# Patient Record
Sex: Female | Born: 1970 | Race: White | Hispanic: No | Marital: Single | Smoking: Former smoker
Health system: Southern US, Community
[De-identification: ages and names within clinical notes are randomized; demographics above are authoritative.]

## PROBLEM LIST (undated history)

## (undated) HISTORY — PX: TUBAL LIGATION: SHX77

---

## 2014-11-19 ENCOUNTER — Telehealth: Payer: Self-pay | Admitting: General Practice

## 2014-11-20 NOTE — Telephone Encounter (Signed)
[facility] not [facility] any medications. Appointment scheduled for 9:10 on 4/18 with Jannifer Rodneyhristy Hawks, FNP

## 2015-01-07 ENCOUNTER — Ambulatory Visit: Payer: Self-pay | Admitting: Family

## 2017-06-24 ENCOUNTER — Encounter: Payer: Self-pay | Admitting: Family

## 2017-06-24 ENCOUNTER — Ambulatory Visit (INDEPENDENT_AMBULATORY_CARE_PROVIDER_SITE_OTHER): Payer: BLUE CROSS/BLUE SHIELD | Admitting: Family

## 2017-06-24 VITALS — BP 130/85 | HR 97 | Temp 98.8°F | Ht 64.75 in | Wt 186.0 lb

## 2017-06-24 DIAGNOSIS — E663 Overweight: Secondary | ICD-10-CM | POA: Insufficient documentation

## 2017-06-24 DIAGNOSIS — Z0000 Encounter for general adult medical examination without abnormal findings: Secondary | ICD-10-CM | POA: Diagnosis not present

## 2017-06-24 DIAGNOSIS — Z23 Encounter for immunization: Secondary | ICD-10-CM | POA: Diagnosis not present

## 2017-06-24 DIAGNOSIS — E669 Obesity, unspecified: Secondary | ICD-10-CM | POA: Insufficient documentation

## 2017-06-24 NOTE — Patient Instructions (Signed)

## 2017-06-24 NOTE — Progress Notes (Signed)
   Subjective:    Patient ID: Laura Walsh, female    DOB: June 17, 1971, 46 y.o.   MRN: [id number]  HPI PT presents to the office today to establish and CPE. PT has not been to a provider in 20 years or more. Pt denies any headache, palpitations, SOB, or edema at this time.     Review of Systems  All other systems reviewed and are negative.  Social History   Social History  . Marital status: Single    Spouse name: N/A  . Number of children: N/A  . Years of education: N/A   Social History Main Topics  . Smoking status: Former Research scientist (life sciences)  . Smokeless tobacco: Never Used  . Alcohol use No  . Drug use: No  . Sexual activity: Not Currently   Other Topics Concern  . None   Social History Narrative  . None   Family History  Problem Relation Age of Onset  . Diabetes Father        Objective:   Physical Exam  Constitutional: She is oriented to person, place, and time. She appears well-developed and well-nourished. No distress.  HENT:  Head: Normocephalic and atraumatic.  Right Ear: External ear normal.  Left Ear: External ear normal.  Nose: Nose normal.  Mouth/Throat: Oropharynx is clear and moist.  Eyes: Pupils are equal, round, and reactive to light.  Neck: Normal range of motion. Neck supple. No thyromegaly present.  Cardiovascular: Normal rate, regular rhythm, normal heart sounds and intact distal pulses.   No murmur heard. Pulmonary/Chest: Effort normal and breath sounds normal. No respiratory distress. She has no wheezes.  Abdominal: Soft. Bowel sounds are normal. She exhibits no distension. There is no tenderness.  Musculoskeletal: Normal range of motion. She exhibits no edema or tenderness.  Neurological: She is alert and oriented to person, place, and time.  Skin: Skin is warm and dry.  Psychiatric: She has a normal mood and affect. Her behavior is normal. Judgment and thought content normal.  Vitals reviewed.     BP 130/85   Pulse 97   Temp 98.8 F (37.1 C)  (Oral)   Ht 5' 4.75" (1.645 m)   Wt 186 lb (84.4 kg)   BMI 31.19 kg/m      Assessment & Plan:  1. Annual physical exam - CMP14+EGFR - CBC with Differential/Platelet - Lipid panel - TSH - VITAMIN D 25 Hydroxy (Vit-D Deficiency, Fractures)  2. Need for immunization against influenza - Flu Vaccine QUAD 36+ mos IM - CMP14+EGFR - CBC with Differential/Platelet  3. Obesity (BMI 30-39.9) - CMP14+EGFR - CBC with Differential/Platelet   Continue all meds Labs pending Health Maintenance reviewed Diet and exercise encouraged RTO 1 year   Evelina Dun, FNP

## 2017-06-25 LAB — CBC WITH DIFFERENTIAL/PLATELET
Basophils Absolute: 0.1 10*3/uL (ref 0.0–0.2)
Basos: 1 %
EOS (ABSOLUTE): 0.1 10*3/uL (ref 0.0–0.4)
EOS: 1 %
HEMATOCRIT: 35.6 % (ref 34.0–46.6)
HEMOGLOBIN: 11.9 g/dL (ref 11.1–15.9)
Immature Grans (Abs): 0 10*3/uL (ref 0.0–0.1)
Immature Granulocytes: 0 %
LYMPHS ABS: 3.2 10*3/uL — AB (ref 0.7–3.1)
Lymphs: 30 %
MCH: 25.9 pg — AB (ref 26.6–33.0)
MCHC: 33.4 g/dL (ref 31.5–35.7)
MCV: 78 fL — ABNORMAL LOW (ref 79–97)
MONOCYTES: 3 %
Monocytes Absolute: 0.4 10*3/uL (ref 0.1–0.9)
NEUTROS ABS: 6.8 10*3/uL (ref 1.4–7.0)
Neutrophils: 65 %
Platelets: 316 10*3/uL (ref 150–379)
RBC: 4.59 x10E6/uL (ref 3.77–5.28)
RDW: 16.3 % — ABNORMAL HIGH (ref 12.3–15.4)
WBC: 10.6 10*3/uL (ref 3.4–10.8)

## 2017-06-25 LAB — CMP14+EGFR
ALBUMIN: 4.4 g/dL (ref 3.5–5.5)
ALK PHOS: 63 IU/L (ref 39–117)
ALT: 12 IU/L (ref 0–32)
AST: 15 IU/L (ref 0–40)
Albumin/Globulin Ratio: 1.8 (ref 1.2–2.2)
BILIRUBIN TOTAL: 0.3 mg/dL (ref 0.0–1.2)
BUN / CREAT RATIO: 19 (ref 9–23)
BUN: 16 mg/dL (ref 6–24)
CO2: 23 mmol/L (ref 20–29)
CREATININE: 0.84 mg/dL (ref 0.57–1.00)
Calcium: 9.7 mg/dL (ref 8.7–10.2)
Chloride: 101 mmol/L (ref 96–106)
GFR calc non Af Amer: 84 mL/min/{1.73_m2} (ref >59)
GFR, EST AFRICAN AMERICAN: 96 mL/min/{1.73_m2} (ref >59)
GLOBULIN, TOTAL: 2.5 g/dL (ref 1.5–4.5)
GLUCOSE: 113 mg/dL — AB (ref 65–99)
Potassium: 4.4 mmol/L (ref 3.5–5.2)
Sodium: 141 mmol/L (ref 134–144)
Total Protein: 6.9 g/dL (ref 6.0–8.5)

## 2017-06-25 LAB — VITAMIN D 25 HYDROXY (VIT D DEFICIENCY, FRACTURES): VIT D 25 HYDROXY: 16.9 ng/mL — AB (ref 30.0–100.0)

## 2017-06-25 LAB — LIPID PANEL
CHOL/HDL RATIO: 4.8 ratio — AB (ref 0.0–4.4)
Cholesterol, Total: 202 mg/dL — ABNORMAL HIGH (ref 100–199)
HDL: 42 mg/dL (ref >39)
LDL CALC: 114 mg/dL — AB (ref 0–99)
TRIGLYCERIDES: 229 mg/dL — AB (ref 0–149)
VLDL Cholesterol Cal: 46 mg/dL — ABNORMAL HIGH (ref 5–40)

## 2017-06-25 LAB — TSH: TSH: 1.28 u[IU]/mL (ref 0.450–4.500)

## 2017-06-28 ENCOUNTER — Other Ambulatory Visit: Payer: Self-pay | Admitting: Family

## 2017-06-28 DIAGNOSIS — E559 Vitamin D deficiency, unspecified: Secondary | ICD-10-CM | POA: Insufficient documentation

## 2017-06-28 MED ORDER — VITAMIN D (ERGOCALCIFEROL) 1.25 MG (50000 UNIT) PO CAPS: 50000.0000 [IU] | ORAL_CAPSULE | ORAL | 3 refills | Status: DC

## 2017-07-01 LAB — HGB A1C W/O EAG: HEMOGLOBIN A1C: 5.3 % (ref 4.8–5.6)

## 2017-07-01 LAB — SPECIMEN STATUS REPORT

## 2019-08-01 ENCOUNTER — Other Ambulatory Visit: Payer: Self-pay

## 2019-08-01 DIAGNOSIS — Z20822 Contact with and (suspected) exposure to covid-19: Secondary | ICD-10-CM

## 2019-08-03 ENCOUNTER — Telehealth: Payer: Self-pay | Admitting: Family

## 2019-08-03 LAB — NOVEL CORONAVIRUS, NAA: SARS-CoV-2, NAA: NOT DETECTED

## 2019-08-03 NOTE — Telephone Encounter (Signed)
Patient was tested at the [facility] location on 08/01/2019 chart doest reflect, please advise

## 2019-08-03 NOTE — Telephone Encounter (Signed)
[facility] contacted in an attempt to locate results. Awaiting follow up from [facility] representative.

## 2019-08-04 ENCOUNTER — Telehealth: Payer: Self-pay

## 2019-08-04 NOTE — Telephone Encounter (Signed)
Pt. Given COVID 19 results, verbalizes understanding. 

## 2019-08-07 NOTE — Telephone Encounter (Signed)
Per pt's request, mailed COVID 19 test results to home address.  Pt. 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  Advised will mark her charts for merging.  Pt. Verb. Understanding.

## 2020-04-17 ENCOUNTER — Encounter: Payer: Self-pay | Admitting: Family

## 2020-04-17 ENCOUNTER — Other Ambulatory Visit: Payer: Self-pay

## 2020-04-17 ENCOUNTER — Ambulatory Visit (INDEPENDENT_AMBULATORY_CARE_PROVIDER_SITE_OTHER): Payer: PRIVATE HEALTH INSURANCE | Admitting: Family

## 2020-04-17 VITALS — BP 112/78 | HR 99 | Temp 98.3°F | Ht 64.74 in | Wt 170.8 lb

## 2020-04-17 DIAGNOSIS — E663 Overweight: Secondary | ICD-10-CM | POA: Diagnosis not present

## 2020-04-17 DIAGNOSIS — Z0001 Encounter for general adult medical examination with abnormal findings: Secondary | ICD-10-CM

## 2020-04-17 DIAGNOSIS — Z111 Encounter for screening for respiratory tuberculosis: Secondary | ICD-10-CM | POA: Diagnosis not present

## 2020-04-17 DIAGNOSIS — Z1159 Encounter for screening for other viral diseases: Secondary | ICD-10-CM

## 2020-04-17 DIAGNOSIS — E559 Vitamin D deficiency, unspecified: Secondary | ICD-10-CM

## 2020-04-17 DIAGNOSIS — Z0000 Encounter for general adult medical examination without abnormal findings: Secondary | ICD-10-CM

## 2020-04-17 NOTE — Patient Instructions (Signed)
Health Maintenance, Female Adopting a healthy lifestyle and getting preventive care are important in promoting health and wellness. Ask your health care provider about:  The right schedule for you to have regular tests and exams.  Things you can do on your own to prevent diseases and keep yourself healthy. What should I know about diet, weight, and exercise? Eat a healthy diet   Eat a diet that includes plenty of vegetables, fruits, low-fat dairy products, and lean protein.  Do not eat a lot of foods that are high in solid fats, added sugars, or sodium. Maintain a healthy weight Body mass index (BMI) is used to identify weight problems. It estimates body fat based on height and weight. Your health care provider can help determine your BMI and help you achieve or maintain a healthy weight. Get regular exercise Get regular exercise. This is one of the most important things you can do for your health. Most adults should:  Exercise for at least 150 minutes each week. The exercise should increase your heart rate and make you sweat (moderate-intensity exercise).  Do strengthening exercises at least twice a week. This is in addition to the moderate-intensity exercise.  Spend less time sitting. Even light physical activity can be beneficial. Watch cholesterol and blood lipids Have your blood tested for lipids and cholesterol at 49 years of age, then have this test every 5 years. Have your cholesterol levels checked more often if:  Your lipid or cholesterol levels are high.  You are older than 49 years of age.  You are at high risk for heart disease. What should I know about cancer screening? Depending on your health history and family history, you may need to have cancer screening at various ages. This may include screening for:  Breast cancer.  Cervical cancer.  Colorectal cancer.  Skin cancer.  Lung cancer. What should I know about heart disease, diabetes, and high blood  pressure? Blood pressure and heart disease  High blood pressure causes heart disease and increases the risk of stroke. This is more likely to develop in people who have high blood pressure readings, are of African descent, or are overweight.  Have your blood pressure checked: ? Every 3-5 years if you are 18-39 years of age. ? Every year if you are 40 years old or older. Diabetes Have regular diabetes screenings. This checks your fasting blood sugar level. Have the screening done:  Once every three years after age 40 if you are at a normal weight and have a low risk for diabetes.  More often and at a younger age if you are overweight or have a high risk for diabetes. What should I know about preventing infection? Hepatitis B If you have a higher risk for hepatitis B, you should be screened for this virus. Talk with your health care provider to find out if you are at risk for hepatitis B infection. Hepatitis C Testing is recommended for:  Everyone born from 1945 through 1965.  Anyone with known risk factors for hepatitis C. Sexually transmitted infections (STIs)  Get screened for STIs, including gonorrhea and chlamydia, if: ? You are sexually active and are younger than 49 years of age. ? You are older than 49 years of age and your health care provider tells you that you are at risk for this type of infection. ? Your sexual activity has changed since you were last screened, and you are at increased risk for chlamydia or gonorrhea. Ask your health care provider if   you are at risk.  Ask your health care provider about whether you are at high risk for HIV. Your health care provider may recommend a prescription medicine to help prevent HIV infection. If you choose to take medicine to prevent HIV, you should first get tested for HIV. You should then be tested every 3 months for as long as you are taking the medicine. Pregnancy  If you are about to stop having your period (premenopausal) and  you may become pregnant, seek counseling before you get pregnant.  Take 400 to 800 micrograms (mcg) of folic acid every day if you become pregnant.  Ask for birth control (contraception) if you want to prevent pregnancy. Osteoporosis and menopause Osteoporosis is a disease in which the bones lose minerals and strength with aging. This can result in bone fractures. If you are 65 years old or older, or if you are at risk for osteoporosis and fractures, ask your health care provider if you should:  Be screened for bone loss.  Take a calcium or vitamin D supplement to lower your risk of fractures.  Be given hormone replacement therapy (HRT) to treat symptoms of menopause. Follow these instructions at home: Lifestyle  Do not use any products that contain nicotine or tobacco, such as cigarettes, e-cigarettes, and chewing tobacco. If you need help quitting, ask your health care provider.  Do not use street drugs.  Do not share needles.  Ask your health care provider for help if you need support or information about quitting drugs. Alcohol use  Do not drink alcohol if: ? Your health care provider tells you not to drink. ? You are pregnant, may be pregnant, or are planning to become pregnant.  If you drink alcohol: ? Limit how much you use to 0-1 drink a day. ? Limit intake if you are breastfeeding.  Be aware of how much alcohol is in your drink. In the [place] one drink equals one 12 oz bottle of beer (355 mL), one 5 oz glass of wine (148 mL), or one 1 oz glass of hard liquor (44 mL). General instructions  Schedule regular health, dental, and eye exams.  Stay current with your vaccines.  Tell your health care provider if: ? You often feel depressed. ? You have ever been abused or do not feel safe at home. Summary  Adopting a healthy lifestyle and getting preventive care are important in promoting health and wellness.  Follow your health care provider's instructions about healthy  diet, exercising, and getting tested or screened for diseases.  Follow your health care provider's instructions on monitoring your cholesterol and blood pressure. This information is not intended to replace advice given to you by your health care provider. Make sure you discuss any questions you have with your health care provider. Document Revised: 08/31/2018 Document Reviewed: 08/31/2018 Elsevier Patient Education  2020 Elsevier Inc.  

## 2020-04-17 NOTE — Progress Notes (Signed)
Subjective:    Patient ID: Laura Walsh, female    DOB: 17-May-1971, 49 y.o.   MRN: [id number]  Chief Complaint  Patient presents with  . Annual Exam    forms for work TB skin test today    HPI PT presents to the office today for CPE without pap. Pt currently not taking any medications. Pt denies any headache, palpitations, SOB, or edema at this time.   She does need a TB test today for work.     Review of Systems  All other systems reviewed and are negative.   Family History  Problem Relation Age of Onset  . Diabetes Father    Social History   Socioeconomic History  . Marital status: Single    Spouse name: Not on file  . Number of children: Not on file  . Years of education: Not on file  . Highest education level: Not on file  Occupational History  . Not on file  Tobacco Use  . Smoking status: Former Research scientist (life sciences)  . Smokeless tobacco: Never Used  Vaping Use  . Vaping Use: Never used  Substance and Sexual Activity  . Alcohol use: No  . Drug use: No  . Sexual activity: Not Currently  Other Topics Concern  . Not on file  Social History Narrative  . Not on file   Social Determinants of Health   Financial Resource Strain:   . Difficulty of Paying Living Expenses:   Food Insecurity:   . Worried About Charity fundraiser in the Last Year:   . Arboriculturist in the Last Year:   Transportation Needs:   . Film/video editor (Medical):   Marland Kitchen Lack of Transportation (Non-Medical):   Physical Activity:   . Days of Exercise per Week:   . Minutes of Exercise per Session:   Stress:   . Feeling of Stress :   Social Connections:   . Frequency of Communication with Friends and Family:   . Frequency of Social Gatherings with Friends and Family:   . Attends Religious Services:   . Active Member of Clubs or Organizations:   . Attends Archivist Meetings:   Marland Kitchen Marital Status:        Objective:   Physical Exam Vitals reviewed.  Constitutional:       General: She is not in acute distress.    Appearance: She is well-developed.  HENT:     Head: Normocephalic and atraumatic.     Right Ear: Tympanic membrane normal.     Left Ear: Tympanic membrane normal.  Eyes:     Pupils: Pupils are equal, round, and reactive to light.  Neck:     Thyroid: No thyromegaly.  Cardiovascular:     Rate and Rhythm: Normal rate and regular rhythm.     Heart sounds: Normal heart sounds. No murmur heard.   Pulmonary:     Effort: Pulmonary effort is normal. No respiratory distress.     Breath sounds: Normal breath sounds. No wheezing.  Abdominal:     General: Bowel sounds are normal. There is no distension.     Palpations: Abdomen is soft.     Tenderness: There is no abdominal tenderness.  Musculoskeletal:        General: No tenderness. Normal range of motion.     Cervical back: Normal range of motion and neck supple.  Skin:    General: Skin is warm and dry.  Neurological:     Mental Status: She  is alert and oriented to person, place, and time.     Cranial Nerves: No cranial nerve deficit.     Deep Tendon Reflexes: Reflexes are normal and symmetric.  Psychiatric:        Behavior: Behavior normal.        Thought Content: Thought content normal.        Judgment: Judgment normal.       BP 112/78   Pulse 99   Temp 98.3 F (36.8 C) (Temporal)   Ht 5' 4.74" (1.644 m)   Wt 170 lb 12.8 oz (77.5 kg)   SpO2 97%   BMI 28.65 kg/m      Assessment & Plan:  Laura Walsh comes in today with chief complaint of Annual Exam (forms for work TB skin test today)   Diagnosis and orders addressed:  1. Encounter for TB tine test - PPD - CMP14+EGFR - CBC with Differential/Platelet  2. Annual physical exam - CMP14+EGFR - CBC with Differential/Platelet - Lipid panel - Hepatitis C antibody - TSH - VITAMIN D 25 Hydroxy (Vit-D Deficiency, Fractures)  3. Vitamin D deficiency - CMP14+EGFR - CBC with Differential/Platelet - VITAMIN D 25 Hydroxy (Vit-D  Deficiency, Fractures)  4. Overweight (BMI 25.0-29.9) - CMP14+EGFR - CBC with Differential/Platelet  5. Need for hepatitis C screening test - CMP14+EGFR - CBC with Differential/Platelet - Hepatitis C antibody   Labs pending Health Maintenance reviewed Diet and exercise encouraged  Follow up plan: 1 year   Evelina Dun, FNP

## 2020-04-18 ENCOUNTER — Other Ambulatory Visit: Payer: Self-pay | Admitting: Family

## 2020-04-18 LAB — CBC WITH DIFFERENTIAL/PLATELET
Basophils Absolute: 0.1 10*3/uL (ref 0.0–0.2)
Basos: 1 %
EOS (ABSOLUTE): 0.1 10*3/uL (ref 0.0–0.4)
Eos: 1 %
Hematocrit: 36.4 % (ref 34.0–46.6)
Hemoglobin: 11 g/dL — ABNORMAL LOW (ref 11.1–15.9)
Immature Grans (Abs): 0 10*3/uL (ref 0.0–0.1)
Immature Granulocytes: 0 %
Lymphocytes Absolute: 3.1 10*3/uL (ref 0.7–3.1)
Lymphs: 35 %
MCH: 23.5 pg — ABNORMAL LOW (ref 26.6–33.0)
MCHC: 30.2 g/dL — ABNORMAL LOW (ref 31.5–35.7)
MCV: 78 fL — ABNORMAL LOW (ref 79–97)
Monocytes Absolute: 0.5 10*3/uL (ref 0.1–0.9)
Monocytes: 5 %
Neutrophils Absolute: 4.9 10*3/uL (ref 1.4–7.0)
Neutrophils: 58 %
Platelets: 280 10*3/uL (ref 150–450)
RBC: 4.69 x10E6/uL (ref 3.77–5.28)
RDW: 17.1 % — ABNORMAL HIGH (ref 11.7–15.4)
WBC: 8.7 10*3/uL (ref 3.4–10.8)

## 2020-04-18 LAB — CMP14+EGFR
ALT: 14 IU/L (ref 0–32)
AST: 31 IU/L (ref 0–40)
Albumin/Globulin Ratio: 1.6 (ref 1.2–2.2)
Albumin: 4.2 g/dL (ref 3.8–4.8)
Alkaline Phosphatase: 60 IU/L (ref 48–121)
BUN/Creatinine Ratio: 19 (ref 9–23)
BUN: 14 mg/dL (ref 6–24)
Bilirubin Total: 0.3 mg/dL (ref 0.0–1.2)
CO2: 21 mmol/L (ref 20–29)
Calcium: 9 mg/dL (ref 8.7–10.2)
Chloride: 101 mmol/L (ref 96–106)
Creatinine, Ser: 0.75 mg/dL (ref 0.57–1.00)
GFR calc Af Amer: 109 mL/min/{1.73_m2} (ref >59)
GFR calc non Af Amer: 95 mL/min/{1.73_m2} (ref >59)
Globulin, Total: 2.6 g/dL (ref 1.5–4.5)
Glucose: 77 mg/dL (ref 65–99)
Potassium: 3.8 mmol/L (ref 3.5–5.2)
Sodium: 135 mmol/L (ref 134–144)
Total Protein: 6.8 g/dL (ref 6.0–8.5)

## 2020-04-18 LAB — HEPATITIS C ANTIBODY: Hep C Virus Ab: <0.1 s/co ratio (ref 0.0–0.9)

## 2020-04-18 LAB — LIPID PANEL
Chol/HDL Ratio: 3.5 ratio (ref 0.0–4.4)
Cholesterol, Total: 180 mg/dL (ref 100–199)
HDL: 51 mg/dL (ref >39)
LDL Chol Calc (NIH): 103 mg/dL — ABNORMAL HIGH (ref 0–99)
Triglycerides: 146 mg/dL (ref 0–149)
VLDL Cholesterol Cal: 26 mg/dL (ref 5–40)

## 2020-04-18 LAB — TSH: TSH: 1.16 u[IU]/mL (ref 0.450–4.500)

## 2020-04-18 LAB — VITAMIN D 25 HYDROXY (VIT D DEFICIENCY, FRACTURES): Vit D, 25-Hydroxy: 27.2 ng/mL — ABNORMAL LOW (ref 30.0–100.0)

## 2020-04-18 MED ORDER — VITAMIN D (ERGOCALCIFEROL) 1.25 MG (50000 UNIT) PO CAPS
50000.0000 [IU] | ORAL_CAPSULE | ORAL | 3 refills | Status: DC
Start: 2020-04-18 — End: 2021-03-14

## 2020-04-19 ENCOUNTER — Ambulatory Visit: Payer: PRIVATE HEALTH INSURANCE

## 2020-04-19 ENCOUNTER — Other Ambulatory Visit: Payer: Self-pay

## 2020-04-19 DIAGNOSIS — Z111 Encounter for screening for respiratory tuberculosis: Secondary | ICD-10-CM

## 2020-04-19 LAB — TB SKIN TEST
Induration: 0 mm
TB Skin Test: NEGATIVE

## 2020-04-19 NOTE — Progress Notes (Signed)
TB skin test read. Result 0MM-negative. Documented in enter/eit results. Pt given form to give to employer. Copy sent to be scanned.

## 2020-10-17 ENCOUNTER — Encounter: Payer: Self-pay | Admitting: Nurse Practitioner

## 2020-10-17 ENCOUNTER — Ambulatory Visit (INDEPENDENT_AMBULATORY_CARE_PROVIDER_SITE_OTHER): Payer: PRIVATE HEALTH INSURANCE | Admitting: Nurse Practitioner

## 2020-10-17 VITALS — BP 135/94 | HR 92 | Temp 97.5°F

## 2020-10-17 DIAGNOSIS — J069 Acute upper respiratory infection, unspecified: Secondary | ICD-10-CM | POA: Insufficient documentation

## 2020-10-17 MED ORDER — DM-GUAIFENESIN ER 30-600 MG PO TB12
1.0000 | ORAL_TABLET | Freq: Two times a day (BID) | ORAL | 0 refills | Status: DC
Start: 2020-10-17 — End: 2021-03-14

## 2020-10-17 NOTE — Progress Notes (Signed)
Acute Office Visit  Subjective:    Patient ID: Laura Walsh, female    DOB: 05-Apr-1971, 50 y.o.   MRN: [id number]  Chief Complaint  Patient presents with  . Shortness of Breath  . Cough    Patient tested pos for covid x 1.5 weeks ago and still tested positive on Monday    Cough This is a new problem. The current episode started in the past 7 days. The problem has been gradually improving. The problem occurs constantly. The cough is non-productive. Pertinent negatives include no chest pain, fever, headaches, nasal congestion, sore throat, shortness of breath, sweats or weight loss. Exacerbated by: covid-19 symptoms. She has tried cool air for the symptoms. The treatment provided mild relief.     History reviewed. No pertinent past medical history.  Past Surgical History:  Procedure Laterality Date  . TUBAL LIGATION      Family History  Problem Relation Age of Onset  . Diabetes Father     Social History   Socioeconomic History  . Marital status: Single    Spouse name: Not on file  . Number of children: Not on file  . Years of education: Not on file  . Highest education level: Not on file  Occupational History  . Not on file  Tobacco Use  . Smoking status: Former Games developer  . Smokeless tobacco: Never Used  Vaping Use  . Vaping Use: Never used  Substance and Sexual Activity  . Alcohol use: No  . Drug use: No  . Sexual activity: Not Currently  Other Topics Concern  . Not on file  Social History Narrative  . Not on file   Social Determinants of Health   Financial Resource Strain: Not on file  Food Insecurity: Not on file  Transportation Needs: Not on file  Physical Activity: Not on file  Stress: Not on file  Social Connections: Not on file  Intimate Partner Violence: Not on file    Outpatient Medications Prior to Visit  Medication Sig Dispense Refill  . Vitamin D, Ergocalciferol, (DRISDOL) 1.25 MG (50000 UNIT) CAPS capsule Take 1 capsule (50,000 Units  total) by mouth every 7 (seven) days. 12 capsule 3   No facility-administered medications prior to visit.     Review of Systems  Constitutional: Negative for fever and weight loss.  HENT: Positive for congestion. Negative for sore throat.   Respiratory: Positive for cough. Negative for shortness of breath.   Cardiovascular: Negative for chest pain.  Gastrointestinal: Negative.   Genitourinary: Negative.   Musculoskeletal: Negative.   Skin: Negative.   Neurological: Negative for headaches.  All other systems reviewed and are negative.      Objective:    Physical Exam Vitals reviewed.  Constitutional:      Appearance: She is well-developed.  HENT:     Head: Normocephalic.     Nose: Nose normal. No congestion.  Eyes:     Conjunctiva/sclera: Conjunctivae normal.  Cardiovascular:     Rate and Rhythm: Normal rate and regular rhythm.     Heart sounds: Normal heart sounds.  Pulmonary:     Comments: Cough Musculoskeletal:        General: Normal range of motion.     Cervical back: Normal range of motion.  Skin:    General: Skin is warm.  Neurological:     Mental Status: She is alert and oriented to person, place, and time.  Psychiatric:        Behavior: Behavior normal.  BP (!) 135/94   Pulse 92   Temp (!) 97.5 F (36.4 C) (Temporal)   SpO2 94%  Wt Readings from Last 3 Encounters:  04/17/20 170 lb 12.8 oz (77.5 kg)  06/24/17 186 lb (84.4 kg)    Health Maintenance Due  Topic Date Due  . PAP SMEAR-Modifier  Never done  . COLONOSCOPY (Pts 45-5yrs Insurance coverage will need to be confirmed)  Never done  . INFLUENZA VACCINE  04/21/2020  . COVID-19 Vaccine (3 - Booster for Moderna series) 08/21/2020    There are no preventive care reminders to display for this patient.   Lab Results  Component Value Date   TSH 1.160 04/17/2020   Lab Results  Component Value Date   WBC 8.7 04/17/2020   HGB 11.0 (L) 04/17/2020   HCT 36.4 04/17/2020   MCV 78 (L)  04/17/2020   PLT 280 04/17/2020   Lab Results  Component Value Date   NA 135 04/17/2020   K 3.8 04/17/2020   CO2 21 04/17/2020   GLUCOSE 77 04/17/2020   BUN 14 04/17/2020   CREATININE 0.75 04/17/2020   BILITOT 0.3 04/17/2020   ALKPHOS 60 04/17/2020   AST 31 04/17/2020   ALT 14 04/17/2020   PROT 6.8 04/17/2020   ALBUMIN 4.2 04/17/2020   CALCIUM 9.0 04/17/2020   Lab Results  Component Value Date   CHOL 180 04/17/2020   Lab Results  Component Value Date   HDL 51 04/17/2020   Lab Results  Component Value Date   LDLCALC 103 (H) 04/17/2020   Lab Results  Component Value Date   TRIG 146 04/17/2020   Lab Results  Component Value Date   CHOLHDL 3.5 04/17/2020   Lab Results  Component Value Date   HGBA1C 5.3 06/24/2017       Assessment & Plan:   Problem List Items Addressed This Visit      Respiratory   Upper respiratory infection with cough and congestion - Primary    Symptoms improving.  Started patient on guaifenesin for ongoing cough and congestion.  Advised patient to follow-up with worsening or unresolved symptoms. Rx sent to pharmacy.          Meds ordered this encounter  Medications  . dextromethorphan-guaiFENesin (MUCINEX DM) 30-600 MG 12hr tablet    Sig: Take 1 tablet by mouth 2 (two) times daily.    Dispense:  30 tablet    Refill:  0    Order Specific Question:   Supervising Provider    Answer:   Raliegh Ip [[id number]]     Daryll Drown, NP

## 2020-10-17 NOTE — Assessment & Plan Note (Signed)
Symptoms improving.  Started patient on guaifenesin for ongoing cough and congestion.  Advised patient to follow-up with worsening or unresolved symptoms. Rx sent to pharmacy.

## 2020-10-17 NOTE — Patient Instructions (Signed)

## 2020-11-15 ENCOUNTER — Other Ambulatory Visit: Payer: Self-pay | Admitting: Family

## 2021-01-20 ENCOUNTER — Other Ambulatory Visit: Payer: Self-pay | Admitting: Family

## 2021-01-20 ENCOUNTER — Encounter: Payer: Self-pay | Admitting: Family Medicine

## 2021-01-20 DIAGNOSIS — Z1231 Encounter for screening mammogram for malignant neoplasm of breast: Secondary | ICD-10-CM

## 2021-02-12 ENCOUNTER — Other Ambulatory Visit: Payer: Self-pay

## 2021-02-12 ENCOUNTER — Ambulatory Visit
Admission: RE | Admit: 2021-02-12 | Discharge: 2021-02-12 | Disposition: A | Discharge status: Home / Self Care | Payer: PRIVATE HEALTH INSURANCE | Source: Ambulatory Visit | Attending: Family | Admitting: Family

## 2021-02-12 DIAGNOSIS — Z1231 Encounter for screening mammogram for malignant neoplasm of breast: Secondary | ICD-10-CM

## 2021-02-28 ENCOUNTER — Encounter: Payer: PRIVATE HEALTH INSURANCE | Admitting: Family

## 2021-03-14 ENCOUNTER — Other Ambulatory Visit: Payer: Self-pay

## 2021-03-14 ENCOUNTER — Other Ambulatory Visit (HOSPITAL_COMMUNITY)
Admission: RE | Admit: 2021-03-14 | Discharge: 2021-03-14 | Disposition: A | Discharge status: Home / Self Care | Payer: PRIVATE HEALTH INSURANCE | Source: Ambulatory Visit | Attending: Family | Admitting: Family

## 2021-03-14 ENCOUNTER — Ambulatory Visit (INDEPENDENT_AMBULATORY_CARE_PROVIDER_SITE_OTHER): Payer: PRIVATE HEALTH INSURANCE | Admitting: Family

## 2021-03-14 ENCOUNTER — Encounter: Payer: Self-pay | Admitting: Family

## 2021-03-14 ENCOUNTER — Encounter: Payer: PRIVATE HEALTH INSURANCE | Admitting: Family

## 2021-03-14 VITALS — BP 101/70 | HR 69 | Temp 97.5°F | Resp 20 | Ht 64.0 in | Wt 161.0 lb

## 2021-03-14 DIAGNOSIS — E663 Overweight: Secondary | ICD-10-CM

## 2021-03-14 DIAGNOSIS — Z0001 Encounter for general adult medical examination with abnormal findings: Secondary | ICD-10-CM | POA: Diagnosis not present

## 2021-03-14 DIAGNOSIS — E559 Vitamin D deficiency, unspecified: Secondary | ICD-10-CM

## 2021-03-14 DIAGNOSIS — Z01419 Encounter for gynecological examination (general) (routine) without abnormal findings: Secondary | ICD-10-CM | POA: Diagnosis present

## 2021-03-14 DIAGNOSIS — Z1211 Encounter for screening for malignant neoplasm of colon: Secondary | ICD-10-CM

## 2021-03-14 DIAGNOSIS — Z0000 Encounter for general adult medical examination without abnormal findings: Secondary | ICD-10-CM

## 2021-03-14 LAB — URINALYSIS
Bilirubin, UA: NEGATIVE
Glucose, UA: NEGATIVE
Ketones, UA: NEGATIVE
Nitrite, UA: NEGATIVE
Protein,UA: NEGATIVE
RBC, UA: NEGATIVE
Specific Gravity, UA: 1.02 (ref 1.005–1.030)
Urobilinogen, Ur: 0.2 mg/dL (ref 0.2–1.0)
pH, UA: 6 (ref 5.0–7.5)

## 2021-03-14 NOTE — Progress Notes (Signed)
Subjective:    Patient ID: Laura Walsh, female    DOB: [birth year], 50 y.o.   MRN: [id number]  Chief Complaint  Patient presents with   Annual Exam   PT presents to the office today for CPE with pap. Pt currently not taking any medications. Pt denies any headache, palpitations, SOB, or edema at this time.  Gynecologic Exam The patient's pertinent negatives include no genital itching, genital lesions, vaginal bleeding or vaginal discharge.     Review of Systems  Genitourinary:  Negative for vaginal discharge.  All other systems reviewed and are negative.     Objective:   Physical Exam Vitals reviewed.  Constitutional:      General: She is not in acute distress.    Appearance: She is well-developed.  HENT:     Head: Normocephalic and atraumatic.     Right Ear: Tympanic membrane normal.     Left Ear: Tympanic membrane normal.  Eyes:     Pupils: Pupils are equal, round, and reactive to light.  Neck:     Thyroid: No thyromegaly.  Cardiovascular:     Rate and Rhythm: Normal rate and regular rhythm.     Heart sounds: Normal heart sounds. No murmur heard. Pulmonary:     Effort: Pulmonary effort is normal. No respiratory distress.     Breath sounds: Normal breath sounds. No wheezing.  Chest:  Breasts:    Right: No swelling, bleeding, inverted nipple, mass, nipple discharge, skin change or tenderness.     Left: No swelling, bleeding, inverted nipple, mass, nipple discharge, skin change or tenderness.  Abdominal:     General: Bowel sounds are normal. There is no distension.     Palpations: Abdomen is soft.     Tenderness: There is no abdominal tenderness.  Genitourinary:    Comments: Bimanual exam- no adnexal masses or tenderness, ovaries nonpalpable   Cervix parous and pink- No discharge  Musculoskeletal:        General: No tenderness. Normal range of motion.     Cervical back: Normal range of motion and neck supple.  Skin:    General: Skin is warm and dry.   Neurological:     Mental Status: She is alert and oriented to person, place, and time.     Cranial Nerves: No cranial nerve deficit.     Deep Tendon Reflexes: Reflexes are normal and symmetric.  Psychiatric:        Behavior: Behavior normal.        Thought Content: Thought content normal.        Judgment: Judgment normal.      BP 101/70   Pulse 69   Temp (!) 97.5 F (36.4 C) (Temporal)   Resp 20   Ht '5\' 4"'  (1.626 m)   Wt 161 lb (73 kg)   SpO2 100%   BMI 27.64 kg/m      Assessment & Plan:  Laura Walsh comes in today with chief complaint of Annual Exam   Diagnosis and orders addressed:  1. Annual physical exam - CMP14+EGFR - CBC with Differential/Platelet - Lipid panel - TSH - VITAMIN D 25 Hydroxy (Vit-D Deficiency, Fractures)  2. Gynecologic exam normal - CMP14+EGFR - CBC with Differential/Platelet - Cytology - PAP([place])  3. Overweight (BMI 25.0-29.9)  4. Vitamin D deficiency - VITAMIN D 25 Hydroxy (Vit-D Deficiency, Fractures)  5. Colon cancer screening - Ambulatory referral to Gastroenterology   Labs pending Health Maintenance reviewed Diet and exercise encouraged  Follow up plan: 1  year   Evelina Dun, FNP

## 2021-03-14 NOTE — Addendum Note (Signed)
Addended by: Cleda Daub on: 03/14/2021 04:35 PM   Modules accepted: Orders

## 2021-03-14 NOTE — Patient Instructions (Signed)
Health Maintenance, Female Adopting a healthy lifestyle and getting preventive care are important in promoting health and wellness. Ask your health care provider about: The right schedule for you to have regular tests and exams. Things you can do on your own to prevent diseases and keep yourself healthy. What should I know about diet, weight, and exercise? Eat a healthy diet  Eat a diet that includes plenty of vegetables, fruits, low-fat dairy products, and lean protein. Do not eat a lot of foods that are high in solid fats, added sugars, or sodium.  Maintain a healthy weight Body mass index (BMI) is used to identify weight problems. It estimates body fat based on height and weight. Your health care provider can help determineyour BMI and help you achieve or maintain a healthy weight. Get regular exercise Get regular exercise. This is one of the most important things you can do for your health. Most adults should: Exercise for at least 150 minutes each week. The exercise should increase your heart rate and make you sweat (moderate-intensity exercise). Do strengthening exercises at least twice a week. This is in addition to the moderate-intensity exercise. Spend less time sitting. Even light physical activity can be beneficial. Watch cholesterol and blood lipids Have your blood tested for lipids and cholesterol at 50 years of age, then havethis test every 5 years. Have your cholesterol levels checked more often if: Your lipid or cholesterol levels are high. You are older than 50 years of age. You are at high risk for heart disease. What should I know about cancer screening? Depending on your health history and family history, you may need to have cancer screening at various ages. This may include screening for: Breast cancer. Cervical cancer. Colorectal cancer. Skin cancer. Lung cancer. What should I know about heart disease, diabetes, and high blood pressure? Blood pressure and heart  disease High blood pressure causes heart disease and increases the risk of stroke. This is more likely to develop in people who have high blood pressure readings, are of African descent, or are overweight. Have your blood pressure checked: Every 3-5 years if you are 18-39 years of age. Every year if you are 40 years old or older. Diabetes Have regular diabetes screenings. This checks your fasting blood sugar level. Have the screening done: Once every three years after age 40 if you are at a normal weight and have a low risk for diabetes. More often and at a younger age if you are overweight or have a high risk for diabetes. What should I know about preventing infection? Hepatitis B If you have a higher risk for hepatitis B, you should be screened for this virus. Talk with your health care provider to find out if you are at risk forhepatitis B infection. Hepatitis C Testing is recommended for: Everyone born from 1945 through 1965. Anyone with known risk factors for hepatitis C. Sexually transmitted infections (STIs) Get screened for STIs, including gonorrhea and chlamydia, if: You are sexually active and are younger than 50 years of age. You are older than 50 years of age and your health care provider tells you that you are at risk for this type of infection. Your sexual activity has changed since you were last screened, and you are at increased risk for chlamydia or gonorrhea. Ask your health care provider if you are at risk. Ask your health care provider about whether you are at high risk for HIV. Your health care provider may recommend a prescription medicine to help   prevent HIV infection. If you choose to take medicine to prevent HIV, you should first get tested for HIV. You should then be tested every 3 months for as long as you are taking the medicine. Pregnancy If you are about to stop having your period (premenopausal) and you may become pregnant, seek counseling before you get  pregnant. Take 400 to 800 micrograms (mcg) of folic acid every day if you become pregnant. Ask for birth control (contraception) if you want to prevent pregnancy. Osteoporosis and menopause Osteoporosis is a disease in which the bones lose minerals and strength with aging. This can result in bone fractures. If you are 65 years old or older, or if you are at risk for osteoporosis and fractures, ask your health care provider if you should: Be screened for bone loss. Take a calcium or vitamin D supplement to lower your risk of fractures. Be given hormone replacement therapy (HRT) to treat symptoms of menopause. Follow these instructions at home: Lifestyle Do not use any products that contain nicotine or tobacco, such as cigarettes, e-cigarettes, and chewing tobacco. If you need help quitting, ask your health care provider. Do not use street drugs. Do not share needles. Ask your health care provider for help if you need support or information about quitting drugs. Alcohol use Do not drink alcohol if: Your health care provider tells you not to drink. You are pregnant, may be pregnant, or are planning to become pregnant. If you drink alcohol: Limit how much you use to 0-1 drink a day. Limit intake if you are breastfeeding. Be aware of how much alcohol is in your drink. In the [place] one drink equals one 12 oz bottle of beer (355 mL), one 5 oz glass of wine (148 mL), or one 1 oz glass of hard liquor (44 mL). General instructions Schedule regular health, dental, and eye exams. Stay current with your vaccines. Tell your health care provider if: You often feel depressed. You have ever been abused or do not feel safe at home. Summary Adopting a healthy lifestyle and getting preventive care are important in promoting health and wellness. Follow your health care provider's instructions about healthy diet, exercising, and getting tested or screened for diseases. Follow your health care provider's  instructions on monitoring your cholesterol and blood pressure. This information is not intended to replace advice given to you by your health care provider. Make sure you discuss any questions you have with your healthcare provider. Document Revised: 08/31/2018 Document Reviewed: 08/31/2018 Elsevier Patient Education  2022 [employer]  

## 2021-03-15 LAB — CMP14+EGFR
ALT: 10 IU/L (ref 0–32)
AST: 13 IU/L (ref 0–40)
Albumin/Globulin Ratio: 1.9 (ref 1.2–2.2)
Albumin: 4.5 g/dL (ref 3.8–4.8)
Alkaline Phosphatase: 47 IU/L (ref 44–121)
BUN/Creatinine Ratio: 21 (ref 9–23)
BUN: 16 mg/dL (ref 6–24)
Bilirubin Total: 0.3 mg/dL (ref 0.0–1.2)
CO2: 23 mmol/L (ref 20–29)
Calcium: 9.4 mg/dL (ref 8.7–10.2)
Chloride: 101 mmol/L (ref 96–106)
Creatinine, Ser: 0.77 mg/dL (ref 0.57–1.00)
Globulin, Total: 2.4 g/dL (ref 1.5–4.5)
Glucose: 81 mg/dL (ref 65–99)
Potassium: 4.3 mmol/L (ref 3.5–5.2)
Sodium: 139 mmol/L (ref 134–144)
Total Protein: 6.9 g/dL (ref 6.0–8.5)
eGFR: 95 mL/min/{1.73_m2} (ref >59)

## 2021-03-15 LAB — LIPID PANEL
Chol/HDL Ratio: 3.3 ratio (ref 0.0–4.4)
Cholesterol, Total: 200 mg/dL — ABNORMAL HIGH (ref 100–199)
HDL: 61 mg/dL (ref >39)
LDL Chol Calc (NIH): 115 mg/dL — ABNORMAL HIGH (ref 0–99)
Triglycerides: 137 mg/dL (ref 0–149)
VLDL Cholesterol Cal: 24 mg/dL (ref 5–40)

## 2021-03-15 LAB — CBC WITH DIFFERENTIAL/PLATELET
Basophils Absolute: 0.1 10*3/uL (ref 0.0–0.2)
Basos: 1 %
EOS (ABSOLUTE): 0.2 10*3/uL (ref 0.0–0.4)
Eos: 2 %
Hematocrit: 35.7 % (ref 34.0–46.6)
Hemoglobin: 11.3 g/dL (ref 11.1–15.9)
Immature Grans (Abs): 0 10*3/uL (ref 0.0–0.1)
Immature Granulocytes: 0 %
Lymphocytes Absolute: 3.7 10*3/uL — ABNORMAL HIGH (ref 0.7–3.1)
Lymphs: 42 %
MCH: 24.7 pg — ABNORMAL LOW (ref 26.6–33.0)
MCHC: 31.7 g/dL (ref 31.5–35.7)
MCV: 78 fL — ABNORMAL LOW (ref 79–97)
Monocytes Absolute: 0.5 10*3/uL (ref 0.1–0.9)
Monocytes: 5 %
Neutrophils Absolute: 4.4 10*3/uL (ref 1.4–7.0)
Neutrophils: 50 %
Platelets: 293 10*3/uL (ref 150–450)
RBC: 4.58 x10E6/uL (ref 3.77–5.28)
RDW: 15.8 % — ABNORMAL HIGH (ref 11.7–15.4)
WBC: 8.8 10*3/uL (ref 3.4–10.8)

## 2021-03-15 LAB — TSH: TSH: 1.53 u[IU]/mL (ref 0.450–4.500)

## 2021-03-15 LAB — VITAMIN D 25 HYDROXY (VIT D DEFICIENCY, FRACTURES): Vit D, 25-Hydroxy: 35.5 ng/mL (ref 30.0–100.0)

## 2021-03-18 ENCOUNTER — Encounter (INDEPENDENT_AMBULATORY_CARE_PROVIDER_SITE_OTHER): Payer: Self-pay | Admitting: *Deleted

## 2021-03-19 LAB — CYTOLOGY - PAP
Diagnosis: NEGATIVE
Diagnosis: REACTIVE

## 2021-07-16 IMAGING — MG MM DIGITAL SCREENING BILAT W/ TOMO AND CAD
8 series · 9 of 24 positions shown · non-contrast
Comparison: Previous exam 11/25/2011 from [HOSP[facility]
[REDACTED].

CLINICAL DATA: Screening.

EXAM:
DIGITAL SCREENING BILATERAL MAMMOGRAM WITH TOMOSYNTHESIS AND CAD
TECHNIQUE: Bilateral screening digital craniocaudal and mediolateral oblique
mammograms were obtained. Bilateral screening digital breast
tomosynthesis was performed. The images were evaluated with
computer-aided detection.

[R MLO synth-2D]
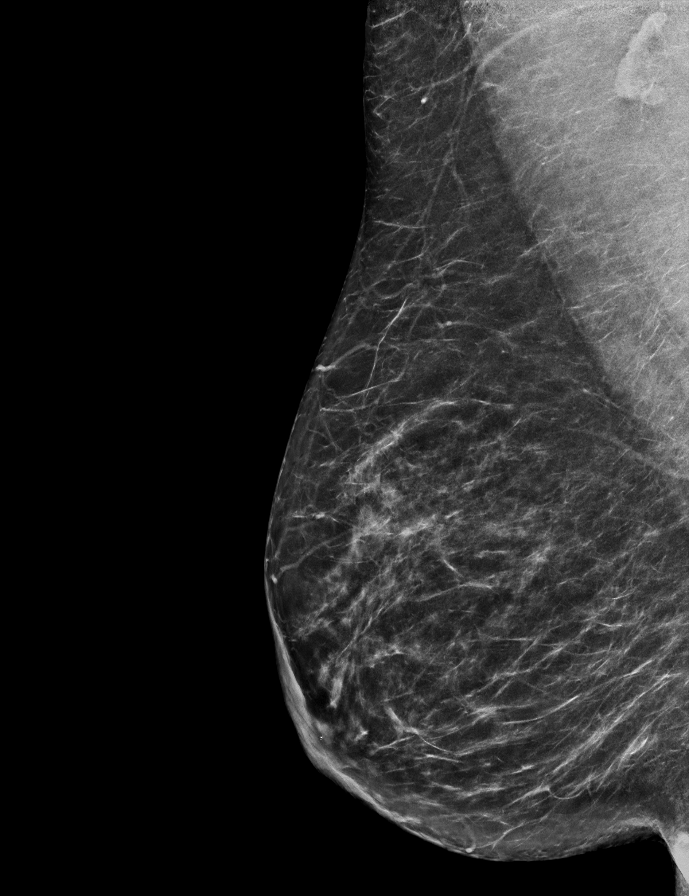

[R CC synth-2D]
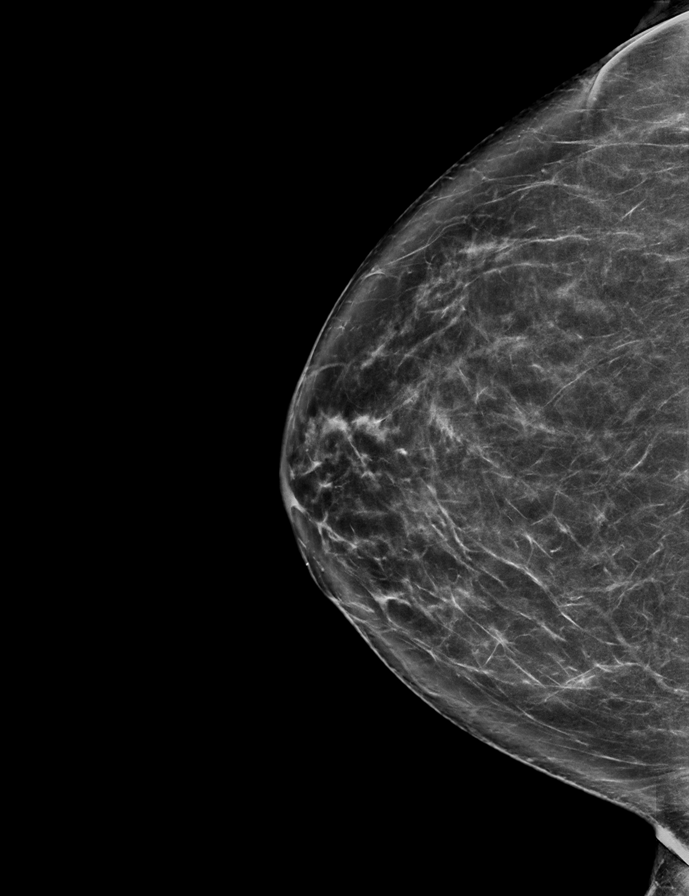

[L MLO synth-2D]
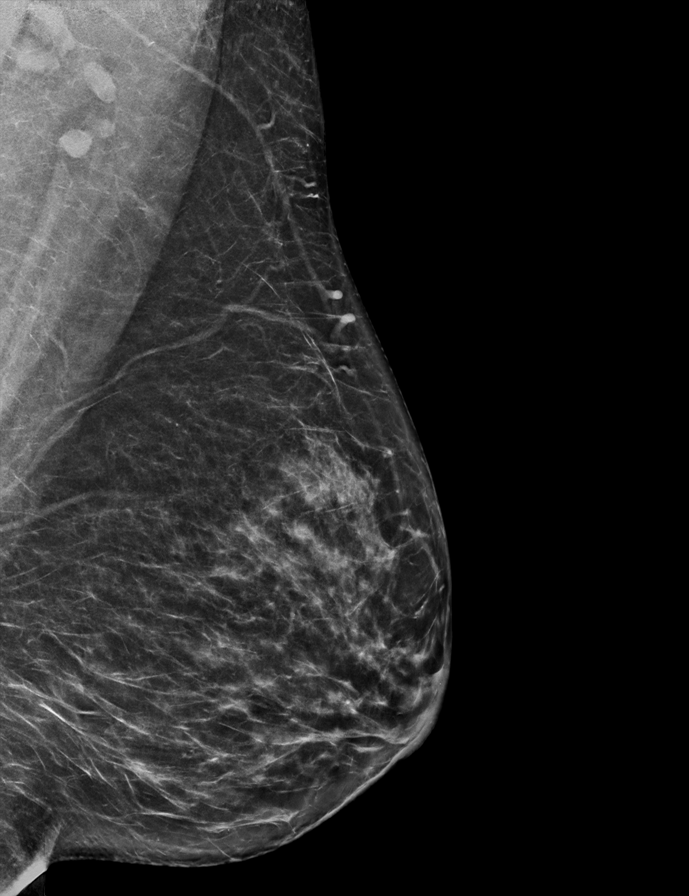

[L CC synth-2D]
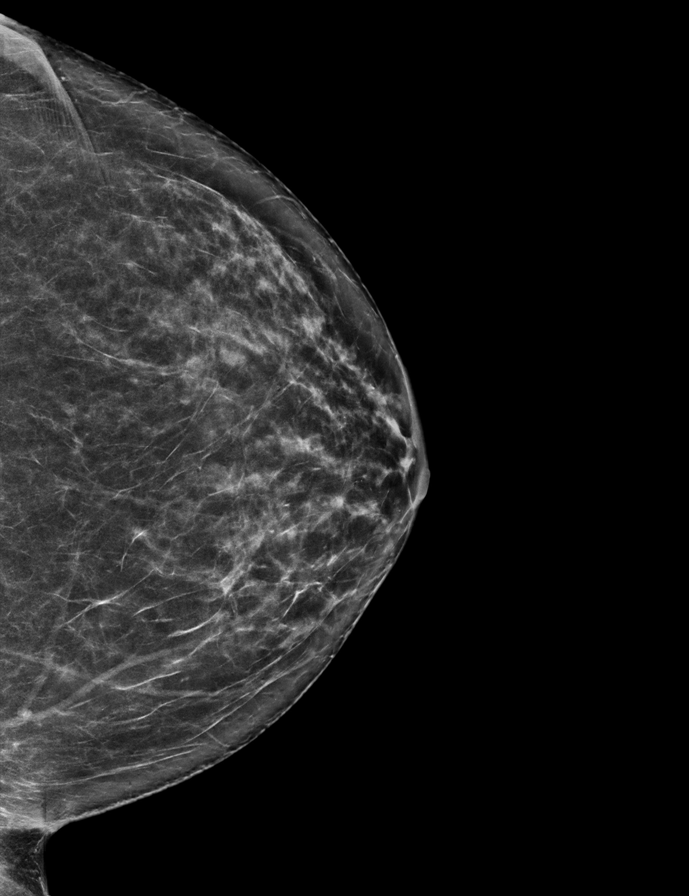

[R CC tomo · 2 of 74 frames shown]
[frame 24/74]
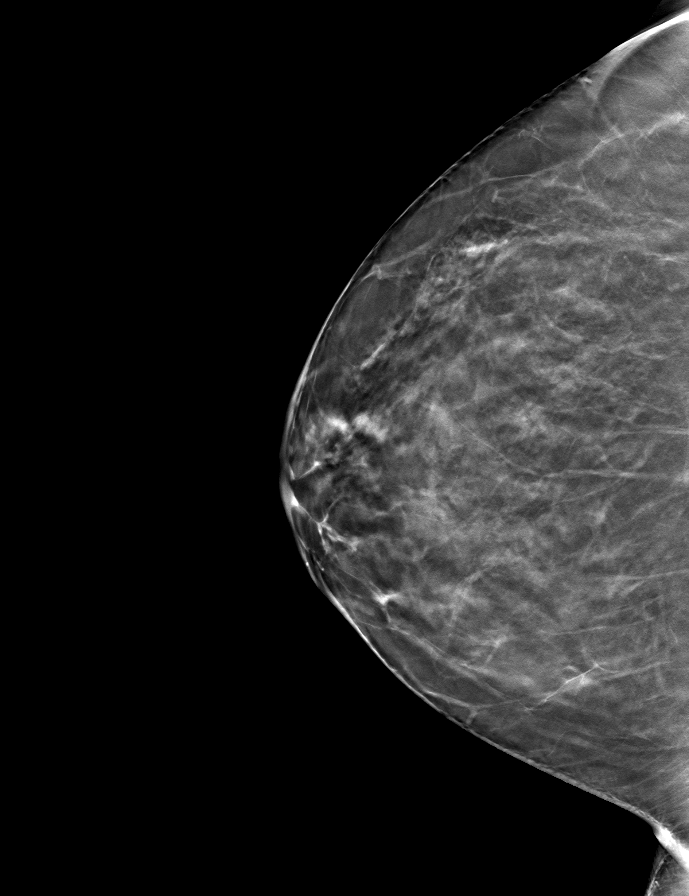
[frame 37/74]
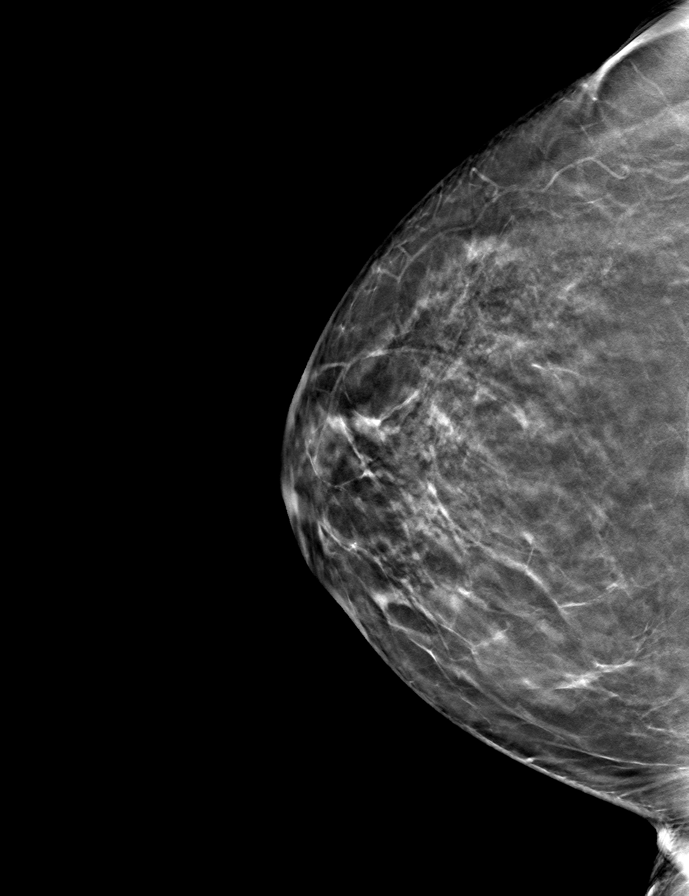

[L MLO tomo · tomo slice 37/72.0]
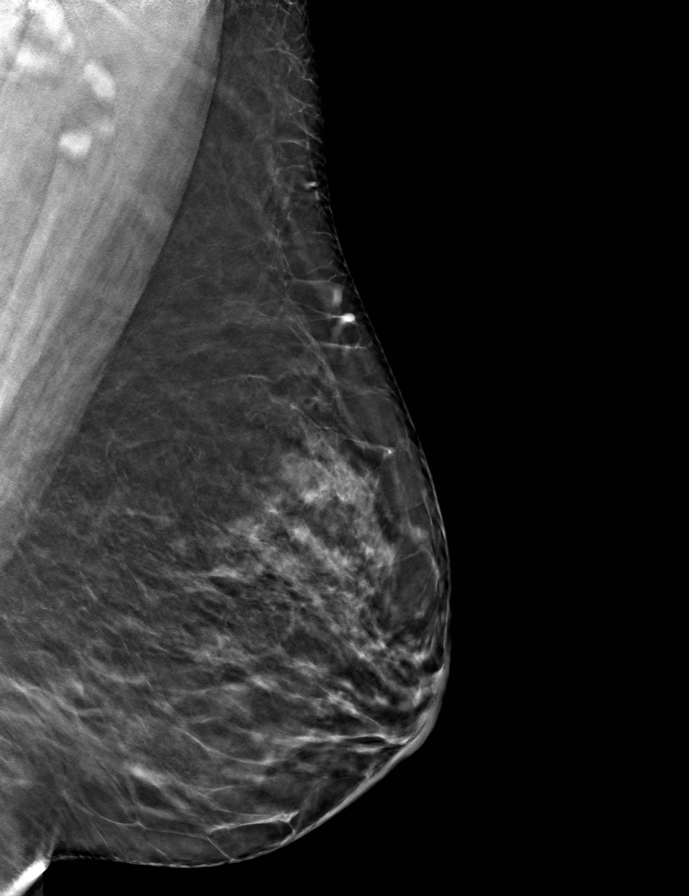

[L CC tomo · tomo slice 35/68.0]
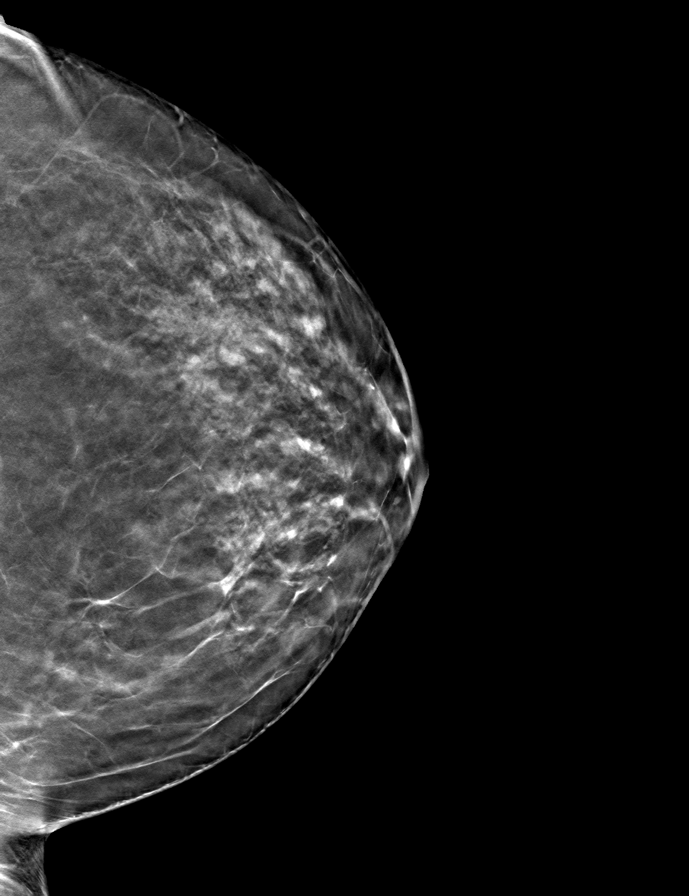

[R MLO tomo · tomo slice 35/69.0]
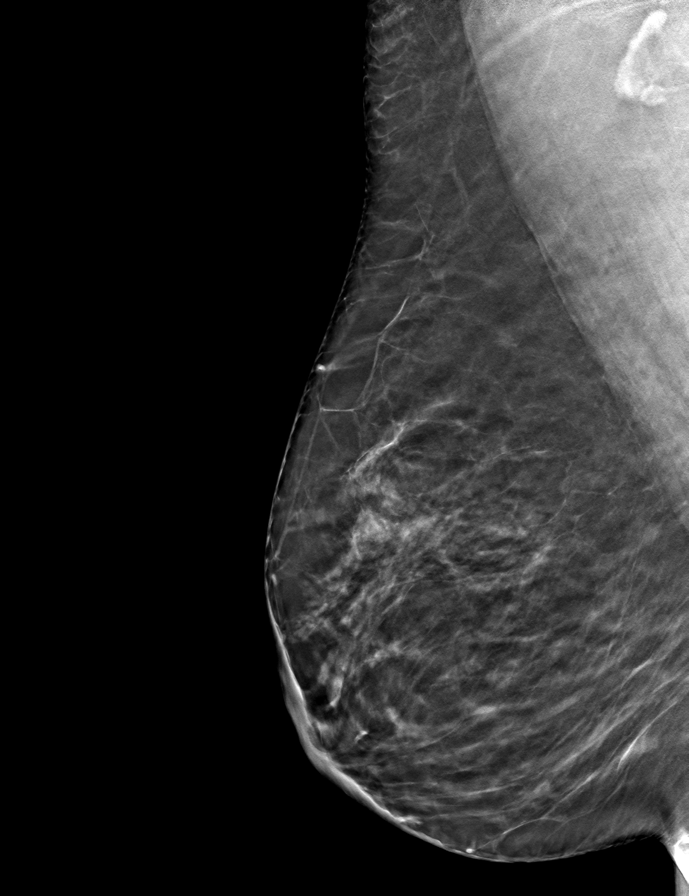

[9 of 24 positions shown; findings below may reference images not displayed]

Awaiting the prior outside mammograms accounts
for the delay in this report.

ACR Breast Density Category b: There are scattered areas of
fibroglandular density.
FINDINGS: There are no findings suspicious for malignancy. The images were
evaluated with computer-aided detection.
IMPRESSION: No mammographic evidence of malignancy. A result letter of this
screening mammogram will be mailed directly to the patient.

RECOMMENDATION:
Screening mammogram in one year. (Code:[id number]-7R2)

BI-RADS CATEGORY  1: Negative.

## 2021-08-01 ENCOUNTER — Ambulatory Visit (INDEPENDENT_AMBULATORY_CARE_PROVIDER_SITE_OTHER): Payer: PRIVATE HEALTH INSURANCE

## 2021-08-01 ENCOUNTER — Other Ambulatory Visit: Payer: Self-pay

## 2021-08-01 DIAGNOSIS — Z23 Encounter for immunization: Secondary | ICD-10-CM | POA: Diagnosis not present

## 2022-08-07 ENCOUNTER — Ambulatory Visit: Payer: PRIVATE HEALTH INSURANCE | Admitting: Nurse Practitioner

## 2022-08-07 ENCOUNTER — Encounter: Payer: Self-pay | Admitting: Nurse Practitioner

## 2022-08-07 VITALS — BP 111/72 | HR 83 | Temp 98.4°F | Ht 64.0 in | Wt 168.4 lb

## 2022-08-07 DIAGNOSIS — H60331 Swimmer's ear, right ear: Secondary | ICD-10-CM | POA: Diagnosis not present

## 2022-08-07 MED ORDER — NEOMYCIN-POLYMYXIN-HC 3.5-10000-1 OT SOLN: 4.0000 [drp] | Freq: Four times a day (QID) | OTIC | 0 refills | Status: DC

## 2022-08-07 NOTE — Progress Notes (Signed)
Acute Office Visit  Subjective:     Patient ID: Laura Walsh, female    DOB: [birth year], 51 y.o.   MRN: [id number]  Chief Complaint  Patient presents with   Ear Fullness    Right fullness for 1 wk    Ear Fullness  There is pain in the right ear. This is a new problem. The current episode started in the past 7 days. The problem has been unchanged. There has been no fever. The pain is mild. Associated symptoms include neck pain. Pertinent negatives include no abdominal pain, coughing, diarrhea, ear discharge, hearing loss, rash or sore throat. She has tried acetaminophen for the symptoms. The treatment provided no relief.     Review of Systems  Constitutional: Negative.  Negative for chills, diaphoresis, fever and malaise/fatigue.  HENT:  Positive for ear pain. Negative for congestion, ear discharge, hearing loss, sinus pain and sore throat.   Respiratory:  Negative for cough and stridor.   Cardiovascular: Negative.   Gastrointestinal:  Negative for abdominal pain and diarrhea.  Musculoskeletal:  Positive for neck pain.  Skin: Negative.  Negative for itching and rash.  All other systems reviewed and are negative.       Objective:    BP 111/72   Pulse 83   Temp 98.4 F (36.9 C) (Temporal)   Ht 5\' 4"  (1.626 m)   Wt 168 lb 6.4 oz (76.4 kg)   SpO2 98%   BMI 28.91 kg/m  BP Readings from Last 3 Encounters:  08/07/22 111/72  03/14/21 101/70  10/17/20 (!) 135/94   Wt Readings from Last 3 Encounters:  08/07/22 168 lb 6.4 oz (76.4 kg)  03/14/21 161 lb (73 kg)  04/17/20 170 lb 12.8 oz (77.5 kg)      Physical Exam Vitals and nursing note reviewed.  Constitutional:      Appearance: Normal appearance.  HENT:     Head: Normocephalic.     Right Ear: External ear normal.     Left Ear: External ear normal.     Nose: Nose normal.     Mouth/Throat:     Mouth: Mucous membranes are moist.     Pharynx: Oropharynx is clear.  Eyes:     Conjunctiva/sclera: Conjunctivae  normal.     Pupils: Pupils are equal, round, and reactive to light.  Cardiovascular:     Rate and Rhythm: Normal rate and regular rhythm.     Pulses: Normal pulses.     Heart sounds: Normal heart sounds.  Pulmonary:     Effort: Pulmonary effort is normal.     Breath sounds: Normal breath sounds.  Abdominal:     General: Bowel sounds are normal.  Skin:    General: Skin is warm.  Neurological:     General: No focal deficit present.     Mental Status: She is alert and oriented to person, place, and time.  Psychiatric:        Mood and Affect: Mood normal.        Behavior: Behavior normal.     No results found for any visits on 08/07/22.      Assessment & Plan:  Patient presents with mild right ear discomfort symptoms present for the past 5 to 6 days.  She denies fever, ear discharge, headache and visual changes.  On assessment patient presents with fluid right eardrum, tenderness and neck pain.  Ibuprofen as needed for pain, cool to warm compress as tolerated, Cortisporin optic drops.  Follow-up with  unresolved symptoms Problem List Items Addressed This Visit   None Visit Diagnoses     Acute swimmer's ear of right side    -  Primary   Relevant Medications   neomycin-polymyxin-hydrocortisone (CORTISPORIN) OTIC solution       Meds ordered this encounter  Medications   neomycin-polymyxin-hydrocortisone (CORTISPORIN) OTIC solution    Sig: Place 4 drops into the right ear 4 (four) times daily.    Dispense:  5 mL    Refill:  0    Order Specific Question:   Supervising Provider    Answer:   Mechele Claude [[id number]]    Return if symptoms worsen or fail to improve.  Daryll Drown, NP

## 2022-08-07 NOTE — Patient Instructions (Signed)
  Earache, Adult An earache, or ear pain, can be caused by many things, including: An infection. Ear wax buildup. Ear pressure. Something in the ear that should not be there (foreign body). A sore throat. Tooth problems. Jaw problems. Treatment of the earache will depend on the cause. If the cause is not clear or cannot be known, you may need to watch your symptoms until your earache goes away or until a cause is found. Follow these instructions at home: Medicines Take or apply over-the-counter and prescription medicines only as told by your health care provider. If you were prescribed antibiotics, use them as told by your health care provider. Do not stop using the antibiotic even if you start to feel better. Do not put anything in your ear other than medicine that is prescribed by your health care provider. Managing pain     If directed, apply heat to the affected area as often as told by your health care provider. Use the heat source that your health care provider recommends, such as a moist heat pack or a heating pad. Place a towel between your skin and the heat source. Leave the heat on for 20-30 minutes. If your skin turns bright red, remove the heat right away to prevent burns. The risk of burns is higher if you cannot feel pain, heat, or cold. If directed, put ice on the affected area. To do this: Put ice in a plastic bag. Place a towel between your skin and the bag. Leave the ice on for 20 minutes, 2-3 times a day. If your skin turns bright red, remove the ice right away to prevent skin damage. The risk of skin damage is higher if you cannot feel pain, heat, or cold.  General instructions Pay attention to any changes in your symptoms. Try resting in an upright position instead of lying down. This may help to reduce pressure in your ear and relieve pain. Chew gum if it helps to relieve your ear pain. Treat any allergies as told by your health care provider. Drink enough  fluid to keep your urine pale yellow. It is up to you to get the results of any tests that were done. Ask your health care provider, or the department that is doing the tests, when your results will be ready. Contact a health care provider if: Your pain does not improve within 2 days. Your earache gets worse. You have new symptoms. You have a fever. Get help right away if: You have a severe headache. You have a stiff neck. You have trouble swallowing. You have redness or swelling behind your ear. You have fluid or blood coming from your ear. You have hearing loss. You feel dizzy. This information is not intended to replace advice given to you by your health care provider. Make sure you discuss any questions you have with your health care provider. Document Revised: 01/19/2022 Document Reviewed: 01/19/2022 Elsevier Patient Education  2023 [employer]  

## 2022-09-30 ENCOUNTER — Encounter: Payer: Self-pay | Admitting: *Deleted

## 2022-10-23 ENCOUNTER — Encounter: Payer: PRIVATE HEALTH INSURANCE | Admitting: Family

## 2022-10-27 ENCOUNTER — Encounter: Payer: PRIVATE HEALTH INSURANCE | Admitting: Family

## 2023-10-20 ENCOUNTER — Ambulatory Visit (INDEPENDENT_AMBULATORY_CARE_PROVIDER_SITE_OTHER): Payer: PRIVATE HEALTH INSURANCE | Admitting: Family Medicine

## 2023-10-20 ENCOUNTER — Encounter: Payer: Self-pay | Admitting: Family Medicine

## 2023-10-20 VITALS — BP 102/68 | HR 110 | Temp 98.7°F | Ht 64.0 in | Wt 168.0 lb

## 2023-10-20 DIAGNOSIS — R062 Wheezing: Secondary | ICD-10-CM

## 2023-10-20 DIAGNOSIS — J029 Acute pharyngitis, unspecified: Secondary | ICD-10-CM | POA: Diagnosis not present

## 2023-10-20 LAB — CULTURE, GROUP A STREP

## 2023-10-20 LAB — RAPID STREP SCREEN (MED CTR MEBANE ONLY): Strep Gp A Ag, IA W/Reflex: NEGATIVE

## 2023-10-20 MED ORDER — ALBUTEROL SULFATE HFA 108 (90 BASE) MCG/ACT IN AERS: 2.0000 | INHALATION_SPRAY | Freq: Four times a day (QID) | RESPIRATORY_TRACT | 2 refills | Status: DC | PRN

## 2023-10-20 NOTE — Progress Notes (Signed)
Subjective:  Patient ID: Laura Walsh, female    DOB: 05/30/1971, 53 y.o.   MRN: [id number]  Patient Care Team: Junie Spencer, FNP as PCP - General (Family Medicine)   Chief Complaint:  Sore Throat (X 1 day/Cough, congestion)  HPI: Laura Walsh is a 53 y.o. female presenting on 10/20/2023 for Sore Throat (X 1 day/Cough, congestion)  Sore Throat    1. Sore throat States that it started Monday with a tickle in her throat and mild cough. States that this morning it started with more pain. Painful to cough. Taking dayquil and ibuprofen, which are helping some. Denies fever. States that ears feel full. States that she has some clear phlegm yesterday. Denies N/V, diarrhea.    Relevant past medical, surgical, family, and social history reviewed and updated as indicated.  Allergies and medications reviewed and updated. Data reviewed: Chart in Epic.   No past medical history on file.  Past Surgical History:  Procedure Laterality Date   TUBAL LIGATION      Social History   Socioeconomic History   Marital status: Single    Spouse name: Not on file   Number of children: Not on file   Years of education: Not on file   Highest education level: Not on file  Occupational History   Not on file  Tobacco Use   Smoking status: Former   Smokeless tobacco: Never  Vaping Use   Vaping status: Never Used  Substance and Sexual Activity   Alcohol use: No   Drug use: No   Sexual activity: Not Currently  Other Topics Concern   Not on file  Social History Narrative   Not on file   Social Drivers of Health   Financial Resource Strain: Not on file  Food Insecurity: Not on file  Transportation Needs: Not on file  Physical Activity: Not on file  Stress: Not on file  Social Connections: Not on file  Intimate Partner Violence: Not on file    Outpatient Encounter Medications as of 10/20/2023  Medication Sig   BIOTIN PO Take by mouth.   Collagen-Vitamin C-Biotin (COLLAGEN PO) Take  by mouth.   Misc Natural Products (IMMUNE ESSENTIALS PO) Take by mouth.   Multiple Vitamin (MULTIVITAMIN ADULT PO) Take by mouth.   Pseudoephedrine-APAP-DM (DAYQUIL PO) Take by mouth.   [DISCONTINUED] neomycin-polymyxin-hydrocortisone (CORTISPORIN) OTIC solution Place 4 drops into the right ear 4 (four) times daily.   No facility-administered encounter medications on file as of 10/20/2023.    No Known Allergies  Review of Systems As per HPI  Objective:  BP 102/68   Pulse (!) 110   Temp 98.7 F (37.1 C)   Ht 5\' 4"  (1.626 m)   Wt 168 lb (76.2 kg)   LMP 09/22/2023 (Exact Date)   SpO2 95%   BMI 28.84 kg/m    Wt Readings from Last 3 Encounters:  10/20/23 168 lb (76.2 kg)  08/07/22 168 lb 6.4 oz (76.4 kg)  03/14/21 161 lb (73 kg)    Physical Exam Constitutional:      General: She is awake. She is not in acute distress.    Appearance: Normal appearance. She is well-developed and well-groomed. She is not ill-appearing, toxic-appearing or diaphoretic.  HENT:     Right Ear: Ear canal and external ear normal. A middle ear effusion is present.     Left Ear: Ear canal and external ear normal. A middle ear effusion is present.     Nose: Congestion and  rhinorrhea present. Rhinorrhea is clear.     Right Sinus: No maxillary sinus tenderness or frontal sinus tenderness.     Left Sinus: No maxillary sinus tenderness or frontal sinus tenderness.     Mouth/Throat:     Lips: Pink. No lesions.     Mouth: Mucous membranes are moist. No injury.     Tongue: No lesions.     Palate: No mass and lesions.     Pharynx: Posterior oropharyngeal erythema and postnasal drip present. No pharyngeal swelling, oropharyngeal exudate or uvula swelling.     Tonsils: No tonsillar exudate or tonsillar abscesses.  Cardiovascular:     Rate and Rhythm: Regular rhythm. Tachycardia present.     Pulses: Normal pulses.          Radial pulses are 2+ on the right side and 2+ on the left side.       Posterior tibial  pulses are 2+ on the right side and 2+ on the left side.     Heart sounds: Normal heart sounds. No murmur heard.    No gallop.  Pulmonary:     Effort: Pulmonary effort is normal. No respiratory distress.     Breath sounds: Stridor and transmitted upper airway sounds present. Examination of the right-upper field reveals wheezing. Examination of the left-upper field reveals wheezing. Wheezing present. No rhonchi or rales.     Comments: Wheeze improved with deep breathing/cough Musculoskeletal:     Cervical back: Full passive range of motion without pain and neck supple.     Right lower leg: No edema.     Left lower leg: No edema.  Skin:    General: Skin is warm.     Capillary Refill: Capillary refill takes less than 2 seconds.  Neurological:     General: No focal deficit present.     Mental Status: She is alert, oriented to person, place, and time and easily aroused. Mental status is at baseline.     GCS: GCS eye subscore is 4. GCS verbal subscore is 5. GCS motor subscore is 6.     Motor: No weakness.  Psychiatric:        Attention and Perception: Attention and perception normal.        Mood and Affect: Mood and affect normal.        Speech: Speech normal.        Behavior: Behavior normal. Behavior is cooperative.        Thought Content: Thought content normal. Thought content does not include homicidal or suicidal ideation. Thought content does not include homicidal or suicidal plan.        Cognition and Memory: Cognition and memory normal.        Judgment: Judgment normal.     Results for orders placed or performed in visit on 03/14/21  Cytology - PAP([facility])   Collection Time: 03/14/21  4:07 PM  Result Value Ref Range   Adequacy      Satisfactory for evaluation; transformation zone component PRESENT.   Diagnosis      - Negative for Intraepithelial Lesions or Malignancy (NILM)   Diagnosis - Benign reactive/reparative changes    Microorganisms Shift in flora suggestive of  bacterial vaginosis   CMP14+EGFR   Collection Time: 03/14/21  4:31 PM  Result Value Ref Range   Glucose 81 65 - 99 mg/dL   BUN 16 6 - 24 mg/dL   Creatinine, Ser 8.29 0.57 - 1.00 mg/dL   eGFR 95 >56 OZ/HYQ/6.57   BUN/Creatinine  Ratio 21 9 - 23   Sodium 139 134 - 144 mmol/L   Potassium 4.3 3.5 - 5.2 mmol/L   Chloride 101 96 - 106 mmol/L   CO2 23 20 - 29 mmol/L   Calcium 9.4 8.7 - 10.2 mg/dL   Total Protein 6.9 6.0 - 8.5 g/dL   Albumin 4.5 3.8 - 4.8 g/dL   Globulin, Total 2.4 1.5 - 4.5 g/dL   Albumin/Globulin Ratio 1.9 1.2 - 2.2   Bilirubin Total 0.3 0.0 - 1.2 mg/dL   Alkaline Phosphatase 47 44 - 121 IU/L   AST 13 0 - 40 IU/L   ALT 10 0 - 32 IU/L  CBC with Differential/Platelet   Collection Time: 03/14/21  4:31 PM  Result Value Ref Range   WBC 8.8 3.4 - 10.8 x10E3/uL   RBC 4.58 3.77 - 5.28 x10E6/uL   Hemoglobin 11.3 11.1 - 15.9 g/dL   Hematocrit 08.6 57.8 - 46.6 %   MCV 78 (L) 79 - 97 fL   MCH 24.7 (L) 26.6 - 33.0 pg   MCHC 31.7 31.5 - 35.7 g/dL   RDW 46.9 (H) 62.9 - 52.8 %   Platelets 293 150 - 450 x10E3/uL   Neutrophils 50 Not Estab. %   Lymphs 42 Not Estab. %   Monocytes 5 Not Estab. %   Eos 2 Not Estab. %   Basos 1 Not Estab. %   Neutrophils Absolute 4.4 1.4 - 7.0 x10E3/uL   Lymphocytes Absolute 3.7 (H) 0.7 - 3.1 x10E3/uL   Monocytes Absolute 0.5 0.1 - 0.9 x10E3/uL   EOS (ABSOLUTE) 0.2 0.0 - 0.4 x10E3/uL   Basophils Absolute 0.1 0.0 - 0.2 x10E3/uL   Immature Granulocytes 0 Not Estab. %   Immature Grans (Abs) 0.0 0.0 - 0.1 x10E3/uL  Lipid panel   Collection Time: 03/14/21  4:31 PM  Result Value Ref Range   Cholesterol, Total 200 (H) 100 - 199 mg/dL   Triglycerides 413 0 - 149 mg/dL   HDL 61 >24 mg/dL   VLDL Cholesterol Cal 24 5 - 40 mg/dL   LDL Chol Calc (NIH) 401 (H) 0 - 99 mg/dL   Chol/HDL Ratio 3.3 0.0 - 4.4 ratio  TSH   Collection Time: 03/14/21  4:31 PM  Result Value Ref Range   TSH 1.530 0.450 - 4.500 uIU/mL  VITAMIN D 25 Hydroxy (Vit-D Deficiency,  Fractures)   Collection Time: 03/14/21  4:31 PM  Result Value Ref Range   Vit D, 25-Hydroxy 35.5 30.0 - 100.0 ng/mL  Urinalysis   Collection Time: 03/14/21  4:35 PM  Result Value Ref Range   Specific Gravity, UA 1.020 1.005 - 1.030   pH, UA 6.0 5.0 - 7.5   Color, UA Yellow Yellow   Appearance Ur Hazy (A) Clear   Leukocytes,UA Trace (A) Negative   Protein,UA Negative Negative/Trace   Glucose, UA Negative Negative   Ketones, UA Negative Negative   RBC, UA Negative Negative   Bilirubin, UA Negative Negative   Urobilinogen, Ur 0.2 0.2 - 1.0 mg/dL   Nitrite, UA Negative Negative       08/07/2022    2:24 PM 03/14/2021    4:04 PM 06/24/2017    4:02 PM  Depression screen PHQ 2/9  Decreased Interest 0 0 0  Down, Depressed, Hopeless 0 0 0  PHQ - 2 Score 0 0 0  Altered sleeping 0 0   Tired, decreased energy 0 0   Change in appetite 0 0   Feeling bad or failure  about yourself  0 0   Trouble concentrating 0 0   Moving slowly or fidgety/restless 0 0   Suicidal thoughts 0 0   PHQ-9 Score 0 0   Difficult doing work/chores Not difficult at all Not difficult at all        08/07/2022    2:25 PM 03/14/2021    4:05 PM  GAD 7 : Generalized Anxiety Score  Nervous, Anxious, on Edge 0 0  Control/stop worrying 0 0  Worry too much - different things 0 0  Trouble relaxing 0 0  Restless 0 0  Easily annoyed or irritable 0 0  Afraid - awful might happen 0 0  Total GAD 7 Score 0 0  Anxiety Difficulty Not difficult at all Not difficult at all      Pertinent labs & imaging results that were available during my care of the patient were reviewed by me and considered in my medical decision making.  Assessment & Plan:  Tereka was seen today for sore throat.  Diagnoses and all orders for this visit:  Sore throat Negative rapid strep in office today. Additional labs as below. Will communicate results to patient once available. Will await results to determine next steps. Discussed with patient  that likely viral. Discussed at home care such as humidifier, throat lozenges, salt water rinses, OTC medications, and increasing fluid intake.  -     Rapid Strep Screen ([facility] ONLY); Future -     Culture, Group A Strep; Future -     Rapid Strep Screen ([facility] ONLY) -     Culture, Group A Strep -     COVID-19, Flu A+B and RSV  Wheeze Will provide medication as below for symptoms. Recommend that patient follow up if symptoms do not improve.  -     albuterol (VENTOLIN HFA) 108 (90 Base) MCG/ACT inhaler; Inhale 2 puffs into the lungs every 6 (six) hours as needed for wheezing or shortness of breath.  Continue all other maintenance medications.  Follow up plan: Return if symptoms worsen or fail to improve.   Continue healthy lifestyle choices, including diet (rich in fruits, vegetables, and lean proteins, and low in salt and simple carbohydrates) and exercise (at least 30 minutes of moderate physical activity daily).  Written and verbal instructions provided   The above assessment and management plan was discussed with the patient. The patient verbalized understanding of and has agreed to the management plan. Patient is aware to call the clinic if they develop any new symptoms or if symptoms persist or worsen. Patient is aware when to return to the clinic for a follow-up visit. Patient educated on when it is appropriate to go to the emergency department.   Neale Burly, DNP-FNP [facility] [address] [place], [place] [address] [phone]

## 2023-10-21 LAB — COVID-19, FLU A+B AND RSV
Influenza A, NAA: DETECTED — AB
Influenza B, NAA: NOT DETECTED
RSV, NAA: NOT DETECTED
SARS-CoV-2, NAA: NOT DETECTED

## 2023-10-22 ENCOUNTER — Encounter: Payer: Self-pay | Admitting: Family Medicine

## 2023-10-22 LAB — CULTURE, GROUP A STREP: Strep A Culture: NEGATIVE

## 2023-10-22 MED ORDER — OSELTAMIVIR PHOSPHATE 75 MG PO CAPS: 75.0000 mg | ORAL_CAPSULE | Freq: Two times a day (BID) | ORAL | 0 refills | Status: AC

## 2023-10-22 NOTE — Progress Notes (Signed)
Patient positive for flu. I can send in Tamiflu if she would like, but it may not be as effective because she is on the end of the treatment window. Recommend increased fluids, tylenol and ibuprofen for fever and body aches, and using a humidifier.

## 2023-10-22 NOTE — Addendum Note (Signed)
Addended by: Neale Burly on: 10/22/2023 05:06 PM   Modules accepted: Orders

## 2023-10-22 NOTE — Progress Notes (Signed)
Sent in tamiflu

## 2023-10-25 ENCOUNTER — Telehealth: Payer: Self-pay | Admitting: Family

## 2023-10-25 DIAGNOSIS — R051 Acute cough: Secondary | ICD-10-CM

## 2023-10-25 MED ORDER — PROMETHAZINE-DM 6.25-15 MG/5ML PO SYRP: 5.0000 mL | ORAL_SOLUTION | Freq: Three times a day (TID) | ORAL | 0 refills | Status: DC | PRN

## 2023-10-25 MED ORDER — BENZONATATE 200 MG PO CAPS: 200.0000 mg | ORAL_CAPSULE | Freq: Three times a day (TID) | ORAL | 1 refills | Status: DC | PRN

## 2023-10-25 MED ORDER — PREDNISONE 10 MG (21) PO TBPK: ORAL_TABLET | ORAL | 0 refills | Status: DC

## 2023-10-25 NOTE — Telephone Encounter (Signed)
 Patient aware and verbalized understanding.

## 2023-10-25 NOTE — Telephone Encounter (Signed)
Copied from CRM [id number]. Topic: Clinical - Medication Question >> Oct 25, 2023 10:17 AM Antony Haste wrote: Reason for CRM: PT is still experiencing a cough/congestion, she is wanting cough medicine to be called in.  CB#: [phone]

## 2023-10-25 NOTE — Telephone Encounter (Signed)
Prednisone, tessalon, and promethazine-DM Prescription sent to pharmacy. Let me know if symptoms worsen or do not improve.

## 2023-10-25 NOTE — Addendum Note (Signed)
Addended by: Jannifer Rodney A on: 10/25/2023 01:51 PM   Modules accepted: Orders

## 2024-02-28 ENCOUNTER — Ambulatory Visit
Admission: RE | Admit: 2024-02-28 | Discharge: 2024-02-28 | Disposition: A | Discharge status: Home / Self Care | Payer: PRIVATE HEALTH INSURANCE | Source: Ambulatory Visit | Attending: Family | Admitting: Family

## 2024-02-28 ENCOUNTER — Other Ambulatory Visit: Payer: Self-pay | Admitting: Family

## 2024-02-28 DIAGNOSIS — Z1231 Encounter for screening mammogram for malignant neoplasm of breast: Secondary | ICD-10-CM

## 2024-03-07 ENCOUNTER — Ambulatory Visit: Payer: PRIVATE HEALTH INSURANCE | Admitting: Family

## 2024-03-07 ENCOUNTER — Encounter: Payer: Self-pay | Admitting: Family

## 2024-03-07 VITALS — BP 111/79 | HR 83 | Temp 97.8°F | Ht 65.0 in | Wt 163.4 lb

## 2024-03-07 DIAGNOSIS — E663 Overweight: Secondary | ICD-10-CM

## 2024-03-07 DIAGNOSIS — E559 Vitamin D deficiency, unspecified: Secondary | ICD-10-CM | POA: Diagnosis not present

## 2024-03-07 DIAGNOSIS — Z0001 Encounter for general adult medical examination with abnormal findings: Secondary | ICD-10-CM | POA: Diagnosis not present

## 2024-03-07 DIAGNOSIS — Z1211 Encounter for screening for malignant neoplasm of colon: Secondary | ICD-10-CM

## 2024-03-07 DIAGNOSIS — Z0000 Encounter for general adult medical examination without abnormal findings: Secondary | ICD-10-CM

## 2024-03-07 NOTE — Progress Notes (Signed)
   Subjective:    Patient ID: Holmes Lusher, female    DOB: [birth year], 53 y.o.   MRN: [id number]  Chief Complaint  Patient presents with   Annual Exam    HPI PT presents to the office today for CPE without pap. Pt currently not taking any prescription medications. Pt denies any headache, palpitations, SOB, or edema at this time.    Review of Systems  All other systems reviewed and are negative.      Objective:   Physical Exam Vitals reviewed.  Constitutional:      General: She is not in acute distress.    Appearance: She is well-developed.  HENT:     Head: Normocephalic and atraumatic.     Right Ear: Tympanic membrane normal.     Left Ear: Tympanic membrane normal.   Eyes:     Pupils: Pupils are equal, round, and reactive to light.   Neck:     Thyroid: No thyromegaly.   Cardiovascular:     Rate and Rhythm: Normal rate and regular rhythm.     Heart sounds: Normal heart sounds. No murmur heard. Pulmonary:     Effort: Pulmonary effort is normal. No respiratory distress.     Breath sounds: Normal breath sounds. No wheezing.  Abdominal:     General: Bowel sounds are normal. There is no distension.     Palpations: Abdomen is soft.     Tenderness: There is no abdominal tenderness.  Genitourinary:    Comments:    Musculoskeletal:        General: No tenderness. Normal range of motion.     Cervical back: Normal range of motion and neck supple.   Skin:    General: Skin is warm and dry.   Neurological:     Mental Status: She is alert and oriented to person, place, and time.     Cranial Nerves: No cranial nerve deficit.     Deep Tendon Reflexes: Reflexes are normal and symmetric.   Psychiatric:        Behavior: Behavior normal.        Thought Content: Thought content normal.        Judgment: Judgment normal.       BP 111/79   Pulse 83   Temp 97.8 F (36.6 C) (Temporal)   Ht 5' 5 (1.651 m)   Wt 163 lb 6.4 oz (74.1 kg)   SpO2 99%   BMI 27.19 kg/m       Assessment & Plan:  Aniston Christman comes in today with chief complaint of Annual Exam   Diagnosis and orders addressed:  1. Annual physical exam (Primary) - Cologuard - CMP14+EGFR - CBC with Differential/Platelet - Lipid panel - VITAMIN D  25 Hydroxy (Vit-D Deficiency, Fractures)  2. Vitamin D  deficiency - CMP14+EGFR - VITAMIN D  25 Hydroxy (Vit-D Deficiency, Fractures)  3. Overweight (BMI 25.0-29.9) - CMP14+EGFR  4. Colon cancer screening - Cologuard - CMP14+EGFR  Labs pending Health Maintenance reviewed Diet and exercise encouraged  Follow up plan: 1 year   Tommas Fragmin, FNP

## 2024-03-07 NOTE — Patient Instructions (Signed)

## 2024-03-08 LAB — CMP14+EGFR
ALT: 15 IU/L (ref 0–32)
AST: 21 IU/L (ref 0–40)
Albumin: 4.5 g/dL (ref 3.8–4.9)
Alkaline Phosphatase: 68 IU/L (ref 44–121)
BUN/Creatinine Ratio: 15 (ref 9–23)
BUN: 13 mg/dL (ref 6–24)
Bilirubin Total: 0.3 mg/dL (ref 0.0–1.2)
CO2: 21 mmol/L (ref 20–29)
Calcium: 10.1 mg/dL (ref 8.7–10.2)
Chloride: 101 mmol/L (ref 96–106)
Creatinine, Ser: 0.84 mg/dL (ref 0.57–1.00)
Globulin, Total: 2.6 g/dL (ref 1.5–4.5)
Glucose: 85 mg/dL (ref 70–99)
Potassium: 4.5 mmol/L (ref 3.5–5.2)
Sodium: 140 mmol/L (ref 134–144)
Total Protein: 7.1 g/dL (ref 6.0–8.5)
eGFR: 84 mL/min/{1.73_m2} (ref >59)

## 2024-03-08 LAB — LIPID PANEL
Chol/HDL Ratio: 3.1 ratio (ref 0.0–4.4)
Cholesterol, Total: 217 mg/dL — ABNORMAL HIGH (ref 100–199)
HDL: 70 mg/dL (ref >39)
LDL Chol Calc (NIH): 125 mg/dL — ABNORMAL HIGH (ref 0–99)
Triglycerides: 123 mg/dL (ref 0–149)
VLDL Cholesterol Cal: 22 mg/dL (ref 5–40)

## 2024-03-08 LAB — CBC WITH DIFFERENTIAL/PLATELET
Basophils Absolute: 0.1 10*3/uL (ref 0.0–0.2)
Basos: 1 %
EOS (ABSOLUTE): 0.1 10*3/uL (ref 0.0–0.4)
Eos: 2 %
Hematocrit: 38.3 % (ref 34.0–46.6)
Hemoglobin: 11.3 g/dL (ref 11.1–15.9)
Immature Grans (Abs): 0 10*3/uL (ref 0.0–0.1)
Immature Granulocytes: 0 %
Lymphocytes Absolute: 3.4 10*3/uL — ABNORMAL HIGH (ref 0.7–3.1)
Lymphs: 48 %
MCH: 22.2 pg — ABNORMAL LOW (ref 26.6–33.0)
MCHC: 29.5 g/dL — ABNORMAL LOW (ref 31.5–35.7)
MCV: 75 fL — ABNORMAL LOW (ref 79–97)
Monocytes Absolute: 0.4 10*3/uL (ref 0.1–0.9)
Monocytes: 5 %
Neutrophils Absolute: 3.1 10*3/uL (ref 1.4–7.0)
Neutrophils: 44 %
Platelets: 378 10*3/uL (ref 150–450)
RBC: 5.09 x10E6/uL (ref 3.77–5.28)
RDW: 15.8 % — ABNORMAL HIGH (ref 11.7–15.4)
WBC: 7.1 10*3/uL (ref 3.4–10.8)

## 2024-03-08 LAB — VITAMIN D 25 HYDROXY (VIT D DEFICIENCY, FRACTURES): Vit D, 25-Hydroxy: 51.7 ng/mL (ref 30.0–100.0)

## 2024-03-13 ENCOUNTER — Ambulatory Visit: Payer: Self-pay | Admitting: Family
